# Patient Record
Sex: Female | Born: 1945 | Race: White | Hispanic: No
Health system: Southern US, Community
[De-identification: ages and names within clinical notes are randomized; demographics above are authoritative.]

---

## 2021-02-18 ENCOUNTER — Encounter (HOSPITAL_COMMUNITY): Payer: Self-pay | Admitting: Emergency Medicine

## 2021-02-18 ENCOUNTER — Other Ambulatory Visit: Payer: Self-pay

## 2021-02-18 ENCOUNTER — Emergency Department (HOSPITAL_COMMUNITY): Payer: Medicare Other

## 2021-02-18 ENCOUNTER — Emergency Department (HOSPITAL_COMMUNITY)
Admission: EM | Admit: 2021-02-18 | Discharge: 2021-02-18 | Disposition: A | Discharge status: Home / Self Care | Payer: Medicare Other | Attending: Emergency Medicine | Admitting: Emergency Medicine

## 2021-02-18 DIAGNOSIS — S0990XA Unspecified injury of head, initial encounter: Secondary | ICD-10-CM | POA: Diagnosis not present

## 2021-02-18 DIAGNOSIS — R0789 Other chest pain: Secondary | ICD-10-CM | POA: Diagnosis not present

## 2021-02-18 DIAGNOSIS — Y92002 Bathroom of unspecified non-institutional (private) residence single-family (private) house as the place of occurrence of the external cause: Secondary | ICD-10-CM | POA: Insufficient documentation

## 2021-02-18 DIAGNOSIS — M545 Low back pain, unspecified: Secondary | ICD-10-CM | POA: Insufficient documentation

## 2021-02-18 DIAGNOSIS — W19XXXA Unspecified fall, initial encounter: Secondary | ICD-10-CM

## 2021-02-18 DIAGNOSIS — W01198A Fall on same level from slipping, tripping and stumbling with subsequent striking against other object, initial encounter: Secondary | ICD-10-CM | POA: Diagnosis not present

## 2021-02-18 DIAGNOSIS — R52 Pain, unspecified: Secondary | ICD-10-CM

## 2021-02-18 DIAGNOSIS — Z91040 Latex allergy status: Secondary | ICD-10-CM | POA: Diagnosis not present

## 2021-02-18 DIAGNOSIS — R0781 Pleurodynia: Secondary | ICD-10-CM

## 2021-02-18 MED ORDER — DICLOFENAC SODIUM 1 % EX GEL: 4.0000 g | Freq: Four times a day (QID) | CUTANEOUS | 0 refills | Status: DC

## 2021-02-18 NOTE — ED Notes (Signed)
Pt provided discharge instructions and prescription information. Pt was given the opportunity to ask questions and questions were answered. Discharge signature not obtained in the setting of the COVID-19 pandemic in order to reduce high touch surfaces.  ° °

## 2021-02-18 NOTE — ED Provider Notes (Signed)
[facility] EMERGENCY DEPARTMENT Provider Note   CSN: [id number] Arrival date & time: 02/18/21  1224     History No chief complaint on file.   Victoria Hurst is a 75 y.o. female.  74 yo F with a chief complaint of a fall.  She was with a friend and needed use the bathroom.  She was in a hurry to get to the bathroom and back and she felt like she had misjudged her steps and tripped over her own feet and fell to the ground.  Complaining mostly of pain to her left side also struck her head and is having some low back pain.  She denies difficulty breathing denies confusion denies vomiting denies blood thinner use.  The history is provided by the patient.  Illness Severity:  Moderate Onset quality:  Gradual Duration:  1 day Timing:  Constant Progression:  Unchanged Chronicity:  New Associated symptoms: headaches and myalgias   Associated symptoms: no chest pain, no congestion, no fever, no nausea, no rhinorrhea, no shortness of breath, no vomiting and no wheezing       History reviewed. No pertinent past medical history.  There are no problems to display for this patient.   History reviewed. No pertinent surgical history.   OB History   No obstetric history on file.     No family history on file.     Home Medications Prior to Admission medications   Medication Sig Start Date End Date Taking? Authorizing Provider  albuterol (VENTOLIN HFA) 108 (90 Base) MCG/ACT inhaler Inhale 2 puffs into the lungs every 6 (six) hours as needed for wheezing or shortness of breath. 11/11/18  Yes [provider]  amLODipine (NORVASC) 10 MG tablet Take 10 mg by mouth daily. 01/24/21  Yes [provider]  atenolol (TENORMIN) 50 MG tablet Take 50 mg by mouth daily. 01/15/21  Yes [provider]  clobetasol ointment (TEMOVATE) 0.05 % Apply 1 application topically at bedtime. 05/12/19  Yes [provider]  diclofenac Sodium (VOLTAREN) 1 % GEL Apply 4 g  topically 4 (four) times daily. 02/18/21  Yes Melene Plan, DO  ergocalciferol (VITAMIN D2) 1.25 MG (50000 UT) capsule Take 50,000 Units by mouth every 30 (thirty) days. 02/11/21  Yes [provider]  esomeprazole (NEXIUM) 40 MG capsule Take 40 mg by mouth 2 (two) times daily as needed for heartburn. 02/03/21  Yes [provider]  fexofenadine (ALLEGRA) 180 MG tablet Take 180 mg by mouth daily. 01/24/21  Yes [provider]    Allergies    Codeine and Latex  Review of Systems   Review of Systems  Constitutional:  Negative for chills and fever.  HENT:  Negative for congestion and rhinorrhea.   Eyes:  Negative for redness and visual disturbance.  Respiratory:  Negative for shortness of breath and wheezing.   Cardiovascular:  Negative for chest pain and palpitations.  Gastrointestinal:  Negative for nausea and vomiting.  Genitourinary:  Negative for dysuria and urgency.  Musculoskeletal:  Positive for arthralgias, back pain and myalgias.  Skin:  Negative for pallor and wound.  Neurological:  Positive for headaches. Negative for dizziness.   Physical Exam Updated Vital Signs BP 133/76   Pulse 63   Temp 98.1 F (36.7 C) (Oral)   Resp 18   SpO2 99%   Physical Exam Vitals and nursing note reviewed.  Constitutional:      General: She is not in acute distress.    Appearance: She is well-developed.  She is not diaphoretic.  HENT:     Head: Normocephalic and atraumatic.  Eyes:     Pupils: Pupils are equal, round, and reactive to light.  Cardiovascular:     Rate and Rhythm: Normal rate and regular rhythm.     Heart sounds: No murmur heard.   No friction rub. No gallop.  Pulmonary:     Effort: Pulmonary effort is normal.     Breath sounds: No wheezing or rales.  Abdominal:     General: There is no distension.     Palpations: Abdomen is soft.     Tenderness: There is no abdominal tenderness.  Musculoskeletal:        General: Tenderness present.     Cervical  back: Normal range of motion and neck supple.     Comments: Tenderness along the left chest wall tenderness about the low back.  No obvious signs of head injury.  Palpated from head to toe without obvious other areas of significant pain.  Skin:    General: Skin is warm and dry.  Neurological:     Mental Status: She is alert and oriented to person, place, and time.  Psychiatric:        Behavior: Behavior normal.    ED Results / Procedures / Treatments   Labs (all labs ordered are listed, but only abnormal results are displayed) Labs Reviewed - No data to display  EKG None  Radiology DG Chest 1 View  Result Date: 02/18/2021 CLINICAL DATA:  Fall EXAM: CHEST  1 VIEW COMPARISON:  None. FINDINGS: The heart size and mediastinal contours are within normal limits. Both lungs are clear. The visualized skeletal structures are unremarkable. IMPRESSION: No active cardiopulmonary disease. Electronically Signed   By: Charlett Nose M.D.   On: 02/18/2021 14:45   DG Pelvis 1-2 Views  Result Date: 02/18/2021 CLINICAL DATA:  Fall EXAM: PELVIS - 1-2 VIEW COMPARISON:  None. FINDINGS: There is no evidence of pelvic fracture or diastasis. No pelvic bone lesions are seen. Hip joints and SI joints are symmetric. IMPRESSION: No acute bony abnormality. Electronically Signed   By: Charlett Nose M.D.   On: 02/18/2021 14:46   CT Head Wo Contrast  Result Date: 02/18/2021 CLINICAL DATA:  Fall EXAM: CT HEAD WITHOUT CONTRAST TECHNIQUE: Contiguous axial images were obtained from the base of the skull through the vertex without intravenous contrast. 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 No acute intracranial abnormality. Specifically, no hemorrhage, hydrocephalus, mass lesion, acute infarction, or significant intracranial injury. Vascular: No hyperdense vessel or unexpected calcification. Skull: No acute calvarial abnormality. Sinuses/Orbits: No acute  findings Other: None IMPRESSION: Old right frontal infarct. No acute intracranial abnormality. Electronically Signed   By: Charlett Nose M.D.   On: 02/18/2021 14:45   CT Lumbar Spine Wo Contrast  Result Date: 02/18/2021 CLINICAL DATA:  Fall, low back pain EXAM: CT LUMBAR SPINE WITHOUT CONTRAST TECHNIQUE: Multidetector CT imaging of the lumbar spine was performed without intravenous contrast administration. Multiplanar CT image reconstructions were also generated. COMPARISON:  CT lumbar spine from 11/21/2020 FINDINGS: Segmentation: The lowest lumbar type non-rib-bearing vertebra is labeled as L5. Alignment: No vertebral subluxation is observed. Vertebrae: No lumbar spine fracture or acute subluxation identified. Paraspinal and other soft tissues: Aortoiliac atherosclerotic vascular disease. Disc levels: Mild right subarticular lateral recess stenosis at L5-S1 due to intervertebral spurring and conjoined right L5 and S1 nerve roots. Not appreciably changed from prior. Slightly accentuated anterior epidural  prominence posterior to the L5 vertebra, possibly due to epidural venous plexus although I cannot totally exclude the possibility of ectopic disc material. IMPRESSION: 1. No lumbar spine fracture or acute subluxation. 2. Mild right subarticular lateral recess stenosis at L5-S1 due to spurring and conjoined right L5 and S1 nerve roots. 3. Prominent epidural venous plexus versus ectopic disc material anterior to the thecal sac at the L5 level, not causing substantial impingement. 4.  Aortic Atherosclerosis (ICD10-I70.0). Electronically Signed   By: Gaylyn Rong M.D.   On: 02/18/2021 14:53   DG Knee Complete 4 Views Left  Result Date: 02/18/2021 CLINICAL DATA:  Fall, left knee pain EXAM: LEFT KNEE - COMPLETE 4+ VIEW COMPARISON:  None. FINDINGS: No evidence of fracture, dislocation, or joint effusion. No evidence of arthropathy or other focal bone abnormality. Soft tissues are unremarkable. IMPRESSION:  Negative. Electronically Signed   By: Charlett Nose M.D.   On: 02/18/2021 14:46    Procedures Procedures   Medications Ordered in ED Medications - No data to display  ED Course  I have reviewed the triage vital signs and the nursing notes.  Pertinent labs & imaging results that were available during my care of the patient were reviewed by me and considered in my medical decision making (see chart for details).    MDM Rules/Calculators/A&P                          75 yo F with a chief complaints of a fall.  This happened prior to arrival.  Nonsyncopal by history.  Well-appearing and not in significant discomfort.  Chest x-ray viewed by me without obvious fracture or pneumothorax.  Possible occult rib fracture.  Will start on incentive spirometry.  PCP follow-up.  7:24 PM:  I have discussed the diagnosis/risks/treatment options with the patient and believe the pt to be eligible for discharge home to follow-up with PCP. We also discussed returning to the ED immediately if new or worsening sx occur. We discussed the sx which are most concerning (e.g., sudden worsening pain, fever, inability to tolerate by mouth) that necessitate immediate return. Medications administered to the patient during their visit and any new prescriptions provided to the patient are listed below.  Medications given during this visit Medications - No data to display   The patient appears reasonably screen and/or stabilized for discharge and I doubt any other medical condition or other [facility] requiring further screening, evaluation, or treatment in the ED at this time prior to discharge.   Final Clinical Impression(s) / ED Diagnoses Final diagnoses:  Fall, initial encounter  Left-sided chest wall pain  Closed head injury, initial encounter  Acute bilateral low back pain without sciatica    Rx / DC Orders ED Discharge Orders          Ordered    diclofenac Sodium (VOLTAREN) 1 % GEL  4 times daily        02/18/21  1915             Melene Plan, DO 02/18/21 1924

## 2021-02-18 NOTE — ED Triage Notes (Signed)
Patient slipped and fell in bathroom today. No loc. Complains of back pain and left elbow pain. No deformity and no bruising noted

## 2021-02-18 NOTE — ED Provider Notes (Signed)
Emergency Medicine Provider Triage Evaluation Note  Kendall Arnell , a 75 y.o. female  was evaluated in triage.  Pt complains of patient presents after a mechanical fall, states she was in the hospital trying to seeing one of her family members that are here, she unfortunate tripped over legs fine onto her left side, states she hit her head, denies losing conscious, is not on anticoagulant.  She endorses head pain, lower back pain, bilateral posterior rib pain, as well as hip pain and left knee pain.  Has no other complaints at this time..  Review of Systems  Positive: Head pain, rib pain Negative: Shortness of breath or chest pain  Physical Exam  BP (!) 148/67 (BP Location: Right Arm)   Pulse (!) 53   Temp 98.1 F (36.7 C) (Oral)   Resp 16   SpO2 98%  Gen:   Awake, no distress   Resp:  Normal effort  MSK:   Moves extremities without difficulty  Other:    Medical Decision Making  Medically screening exam initiated at 1:58 PM.  Appropriate orders placed.  Yezenia Fredrick was informed that the remainder of the evaluation will be completed by another provider, this initial triage assessment does not replace that evaluation, and the importance of remaining in the ED until their evaluation is complete.  Patient presents for a fall, imaging was ordered, patient had further work-up in the emergency department.   Carroll Sage, PA-C 02/18/21 1359    Gerhard Munch, MD 02/21/21 0930

## 2021-02-18 NOTE — Discharge Instructions (Addendum)
We did not find an obvious break to one of your bones or bleeding inside your head.  Please take Tylenol and the gel I prescribed you for pain.  Even though your chest x-ray does not show an obvious rib fracture is not uncommon to still have a regular rib is not seen on x-ray.  Please use the incentive spirometer 10 minutes out of every hour that you are awake for the next week.  Please call your family doctor and let them know that this happened to you.  They need to evaluate you in the office probably in about a week.  Whenever you to fall this could be a sign that there is something wrong and your doctor needs to evaluate your medications and maybe consider physical therapy.  Please return to the emergency department for fever or shortness of breath.

## 2023-02-28 IMAGING — CT CT HEAD W/O CM
4 series · 16 of 47 positions shown, 18 images · non-contrast
Comparison: 11/21/2020

CLINICAL DATA: Fall

EXAM:
CT HEAD WITHOUT CONTRAST
TECHNIQUE: Contiguous axial images were obtained from the base of the skull
through the vertex without intravenous contrast.

[Series 3: head without · axial · non-contrast · 0.44mm/px · z∈[-134,-14]mm · 7 of 32 slices shown, 9 images]
[im 4/32  brain]
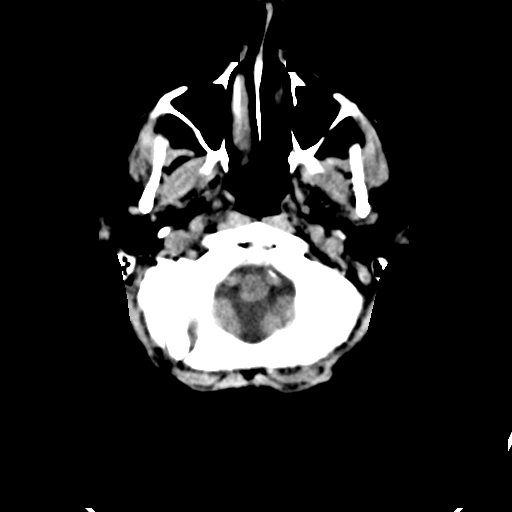
[im 4/32  bone]
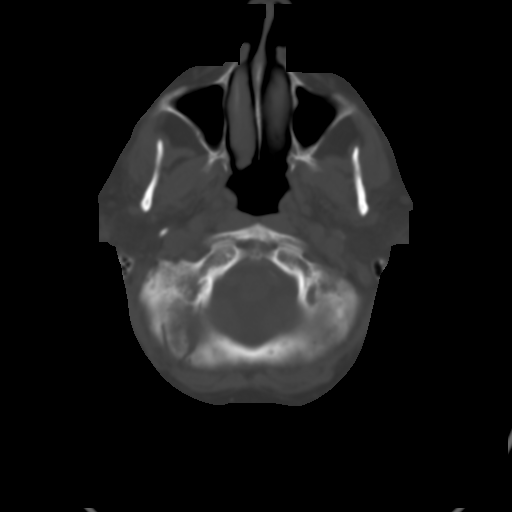
[im 8/32  brain]
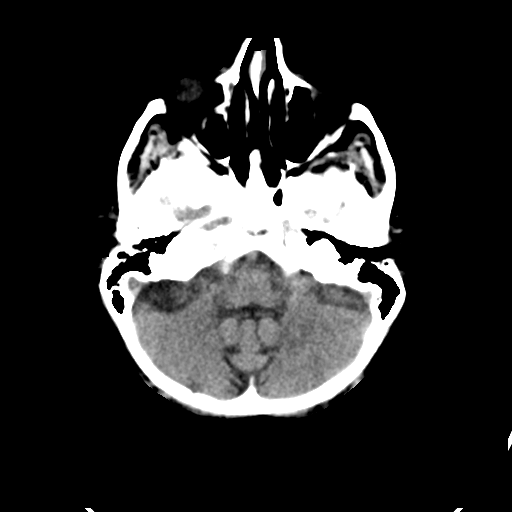
[im 12/32  brain]
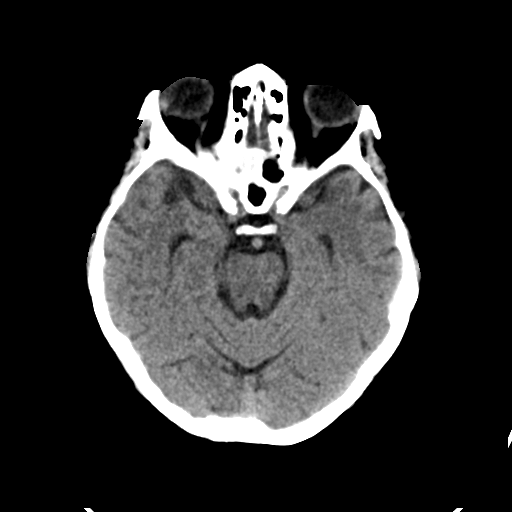
[im 16/32  brain]
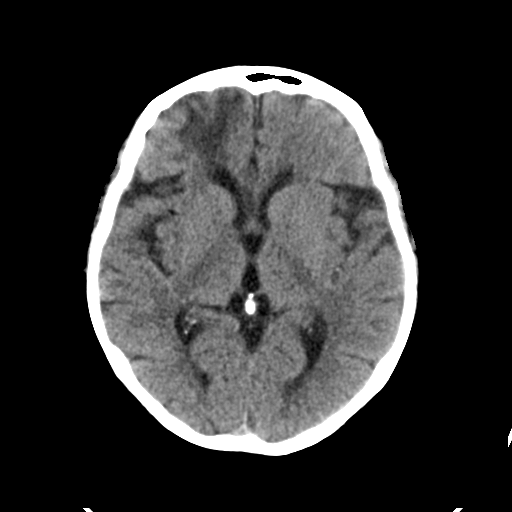
[im 20/32  brain]
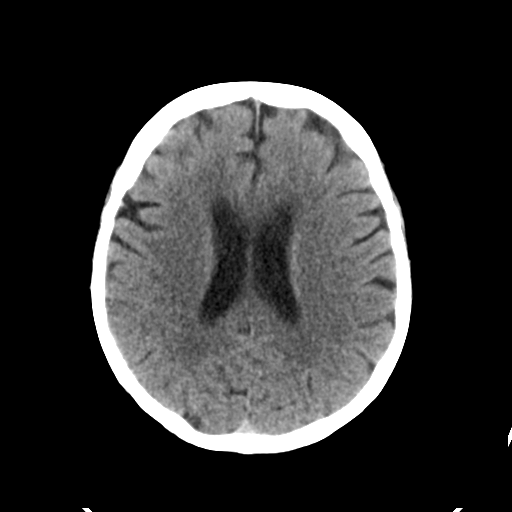
[im 20/32  bone]
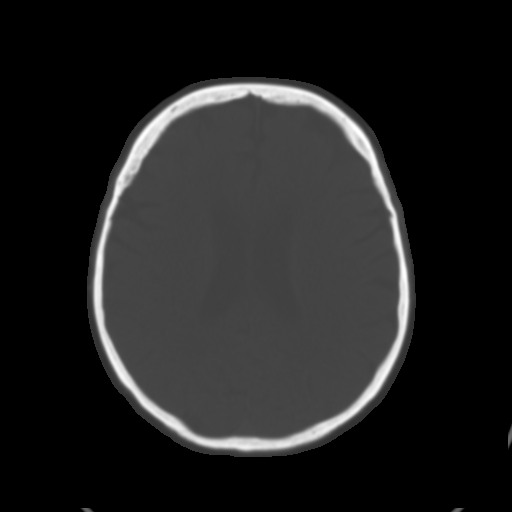
[im 24/32  brain]
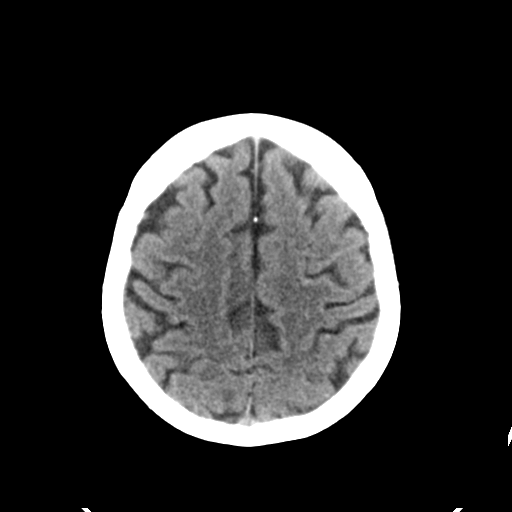
[im 28/32  brain]
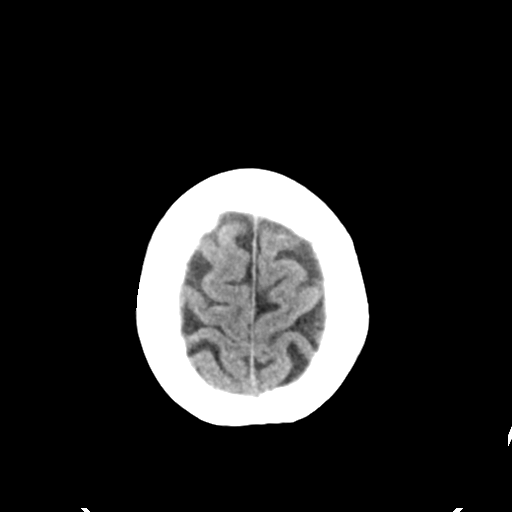

[Series 4: head bone · axial · 0.44mm/px · z∈[-135,-103]mm · 3 of 79 slices shown]
[im 8/79  bone]
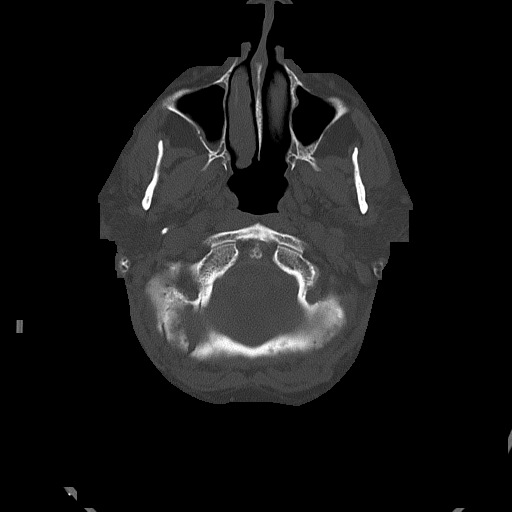
[im 16/79  bone]
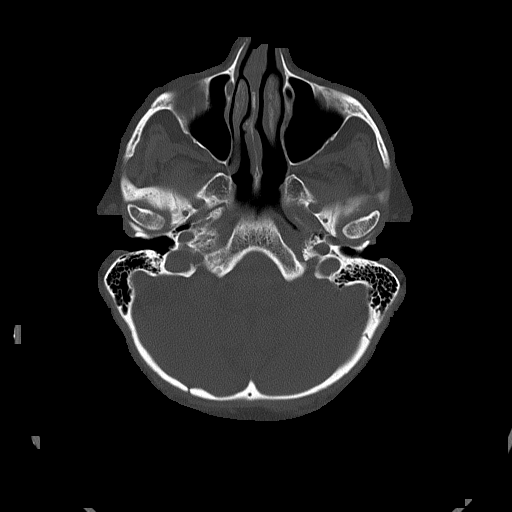
[im 24/79  bone]
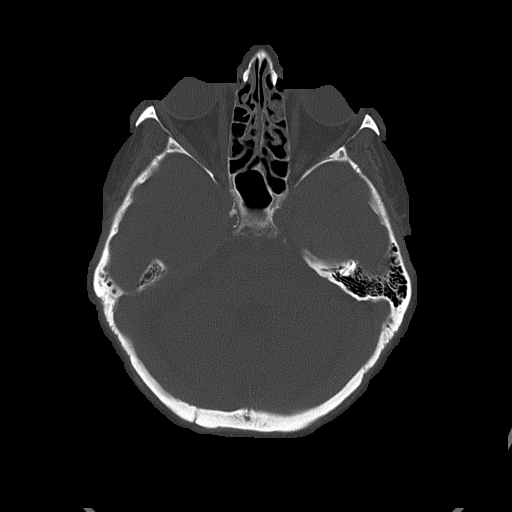

[Series 5: head without cor · coronal · non-contrast · 0.32mm/px · 3 of 67 slices shown]
[im 23/67  brain]
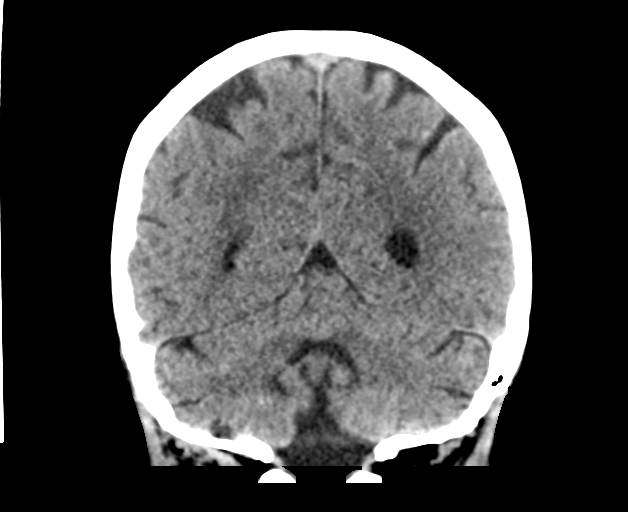
[im 30/67  brain]
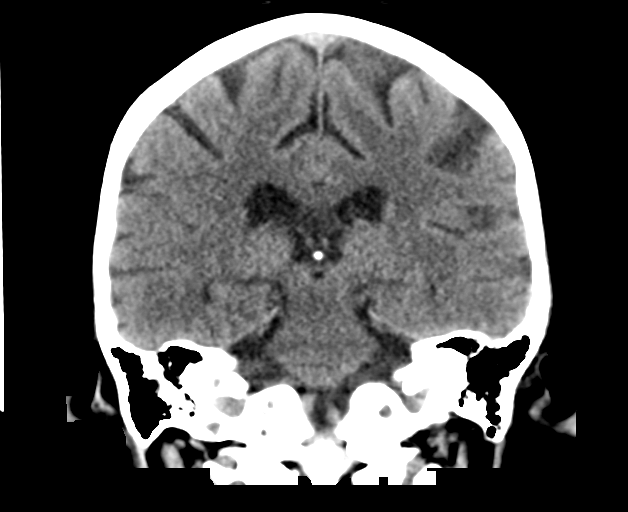
[im 37/67  brain]
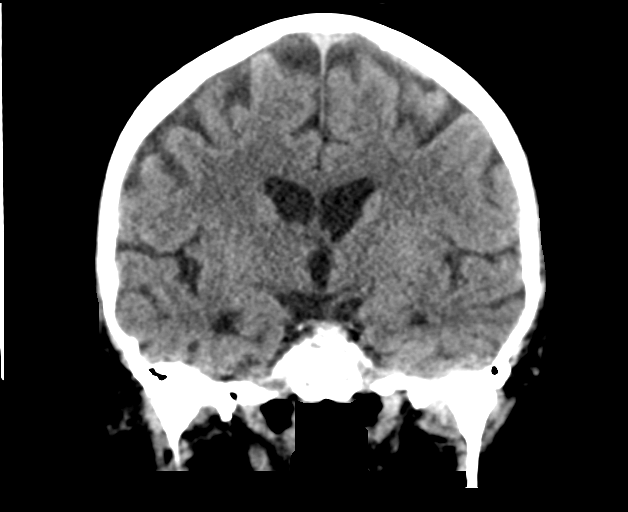

[Series 6: head without sag · sagittal · non-contrast · 0.33mm/px · 3 of 66 slices shown]
[im 22/66  brain]
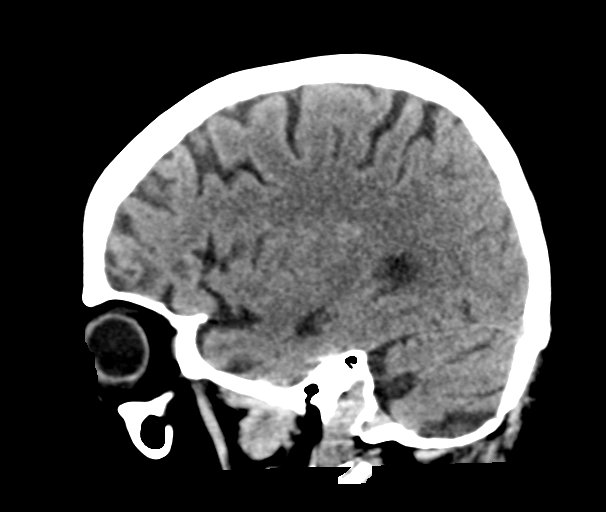
[im 33/66  brain]
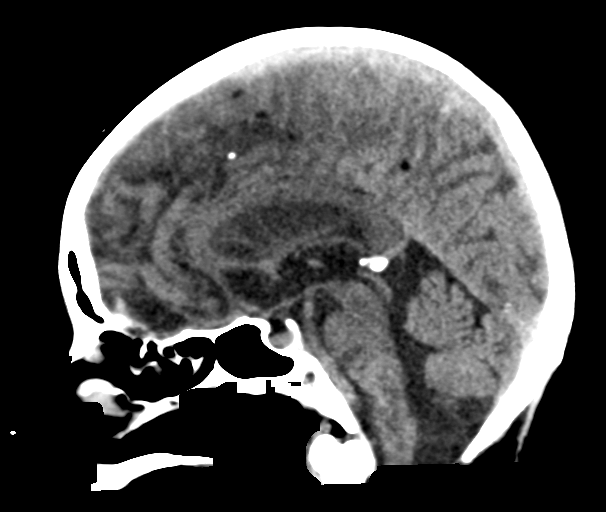
[im 44/66  brain]
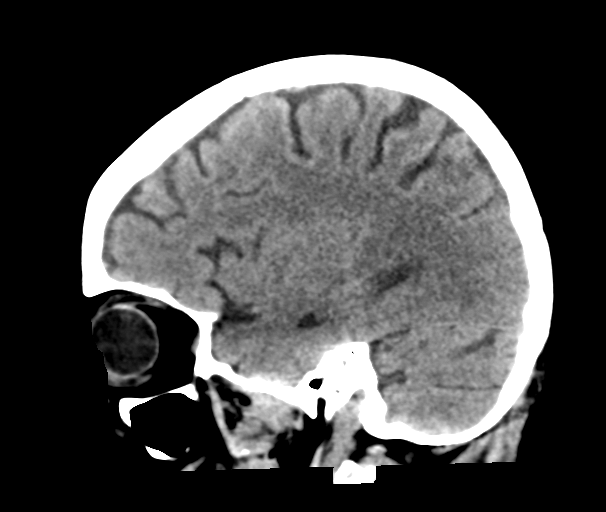

[16 of 47 positions shown; findings below may reference images not displayed]

FINDINGS: Brain: Old infarct with encephalomalacia in the right frontal lobe,
stable. No acute intracranial abnormality. Specifically, no
hemorrhage, hydrocephalus, mass lesion, acute infarction, or
significant intracranial injury.

Vascular: No hyperdense vessel or unexpected calcification.

Skull: No acute calvarial abnormality.

Sinuses/Orbits: No acute findings

Other: None
IMPRESSION: Old right frontal infarct.

No acute intracranial abnormality.

## 2023-02-28 IMAGING — DX DG KNEE COMPLETE 4+V*L*
5 series · 5 of 5 positions shown · non-contrast
Comparison: None.

CLINICAL DATA: Fall, left knee pain

EXAM:
LEFT KNEE - COMPLETE 4+ VIEW

[knee ap]
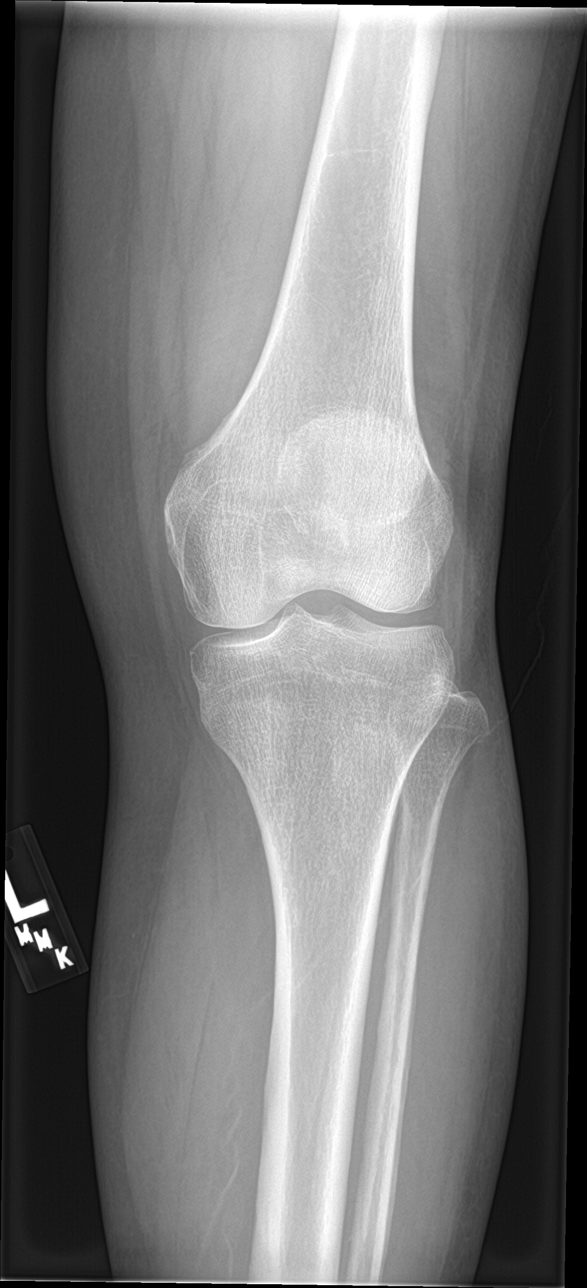

[knee lat (1 of 2)]
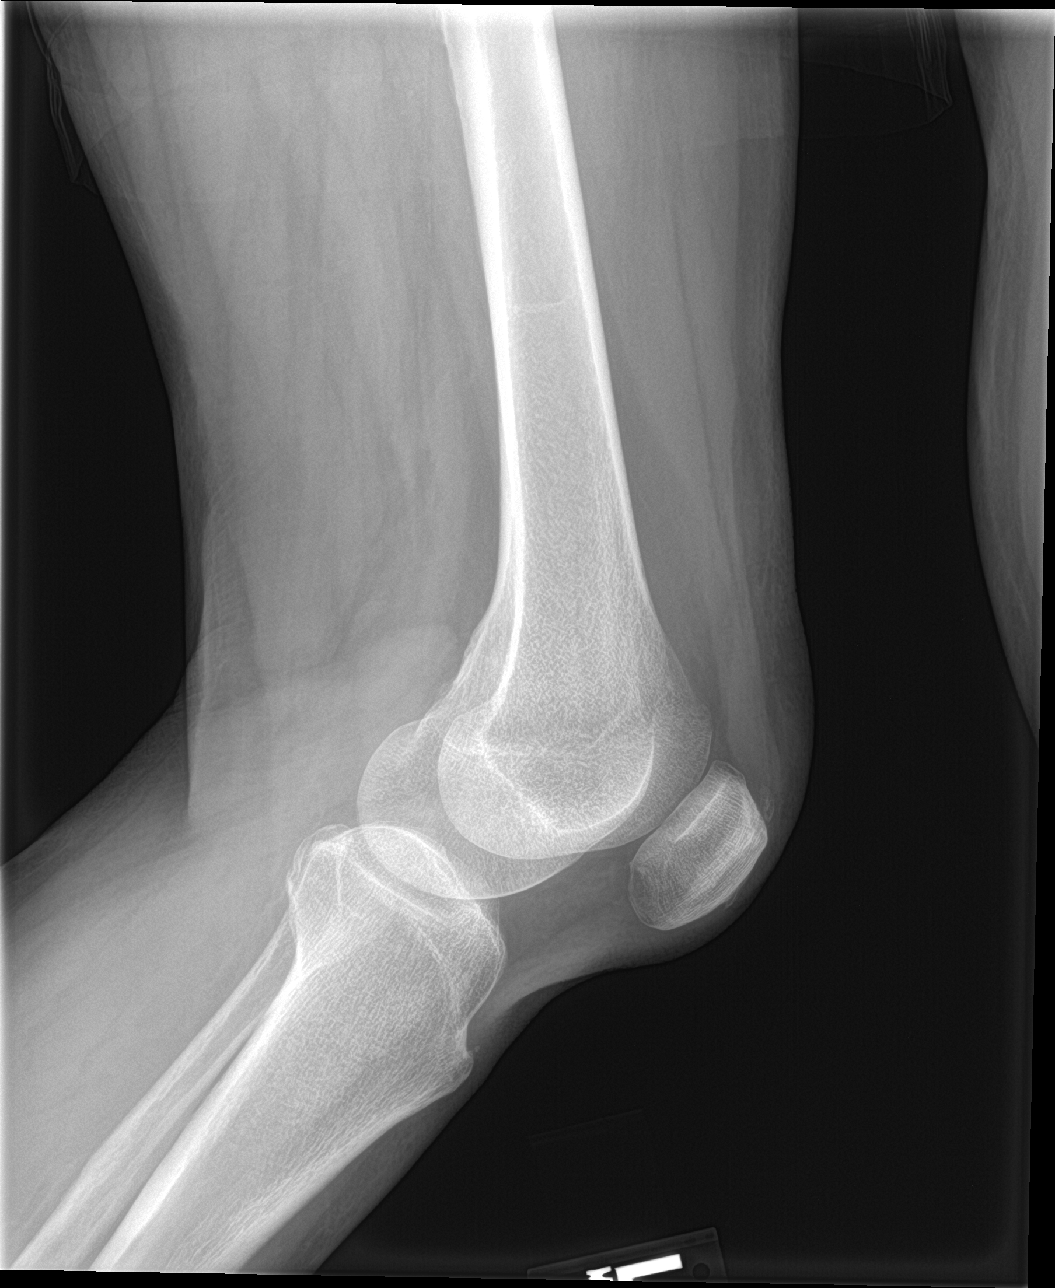

[knee obl (1 of 2)]
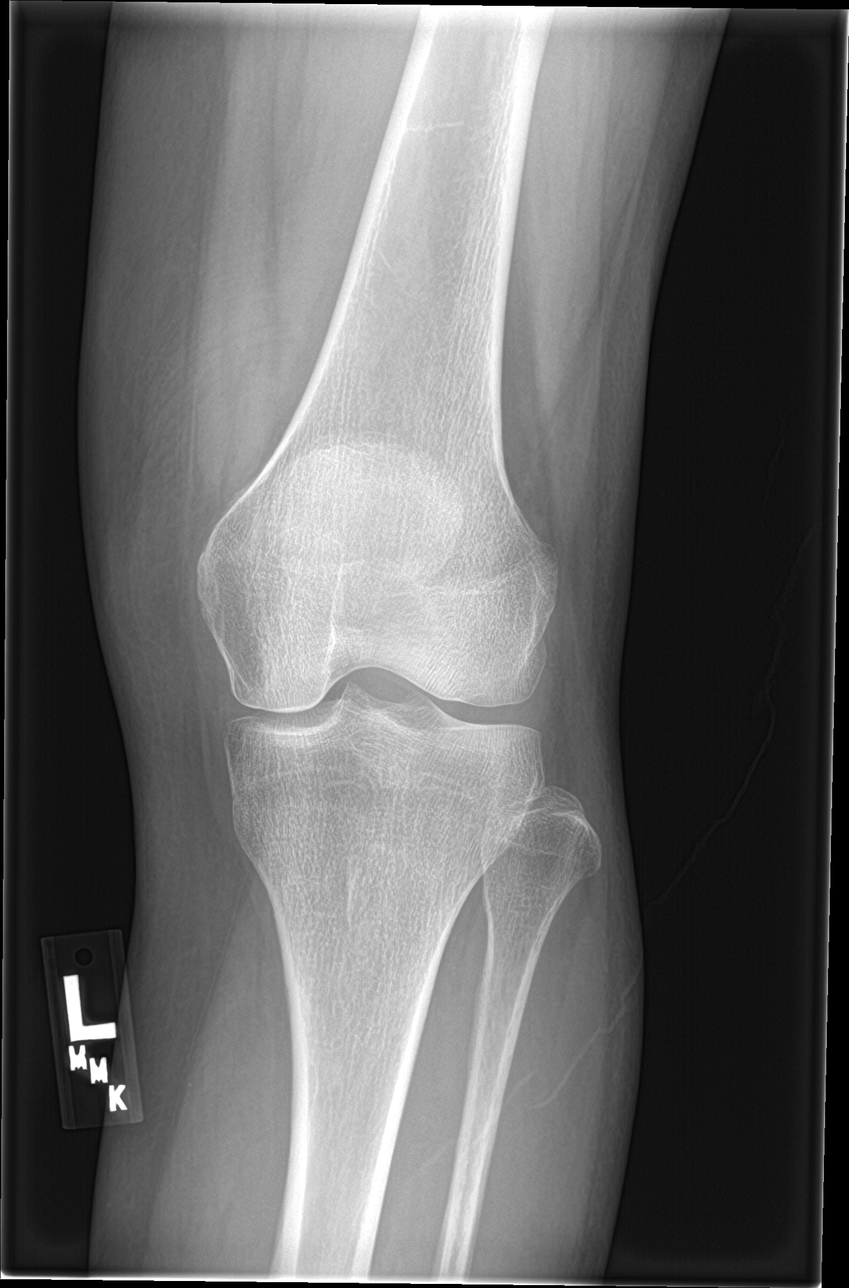

[knee obl (2 of 2)]
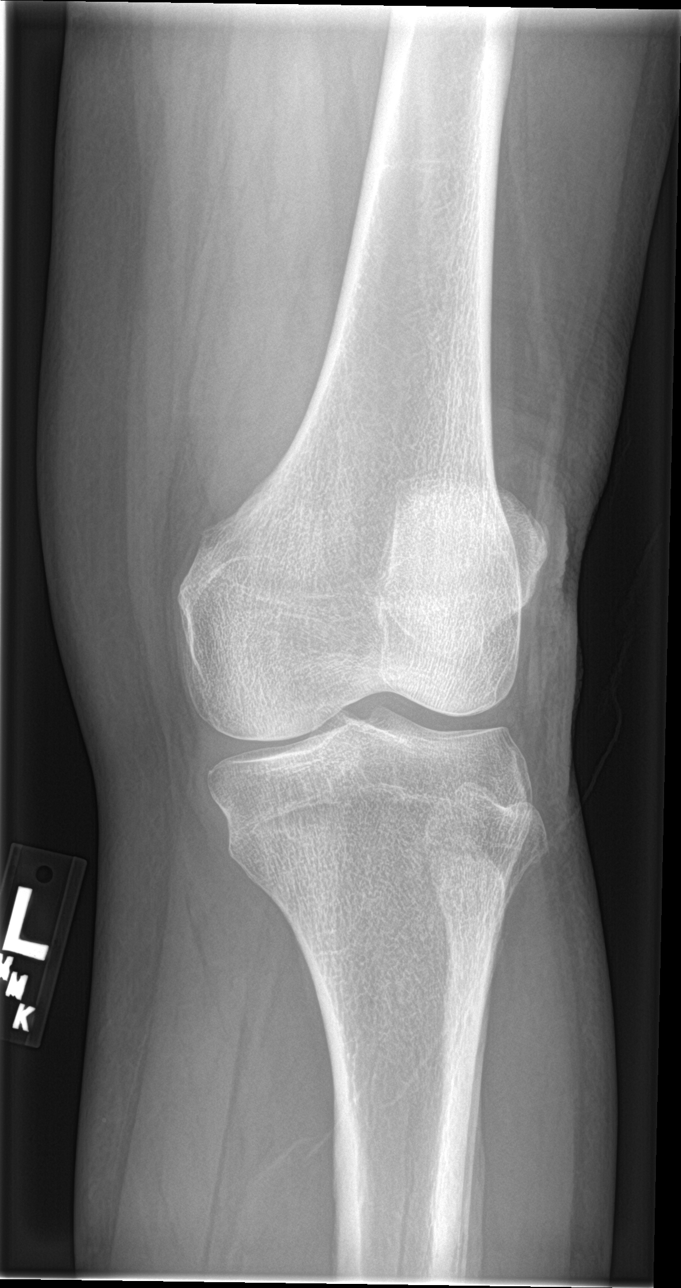

[knee lat (2 of 2)]
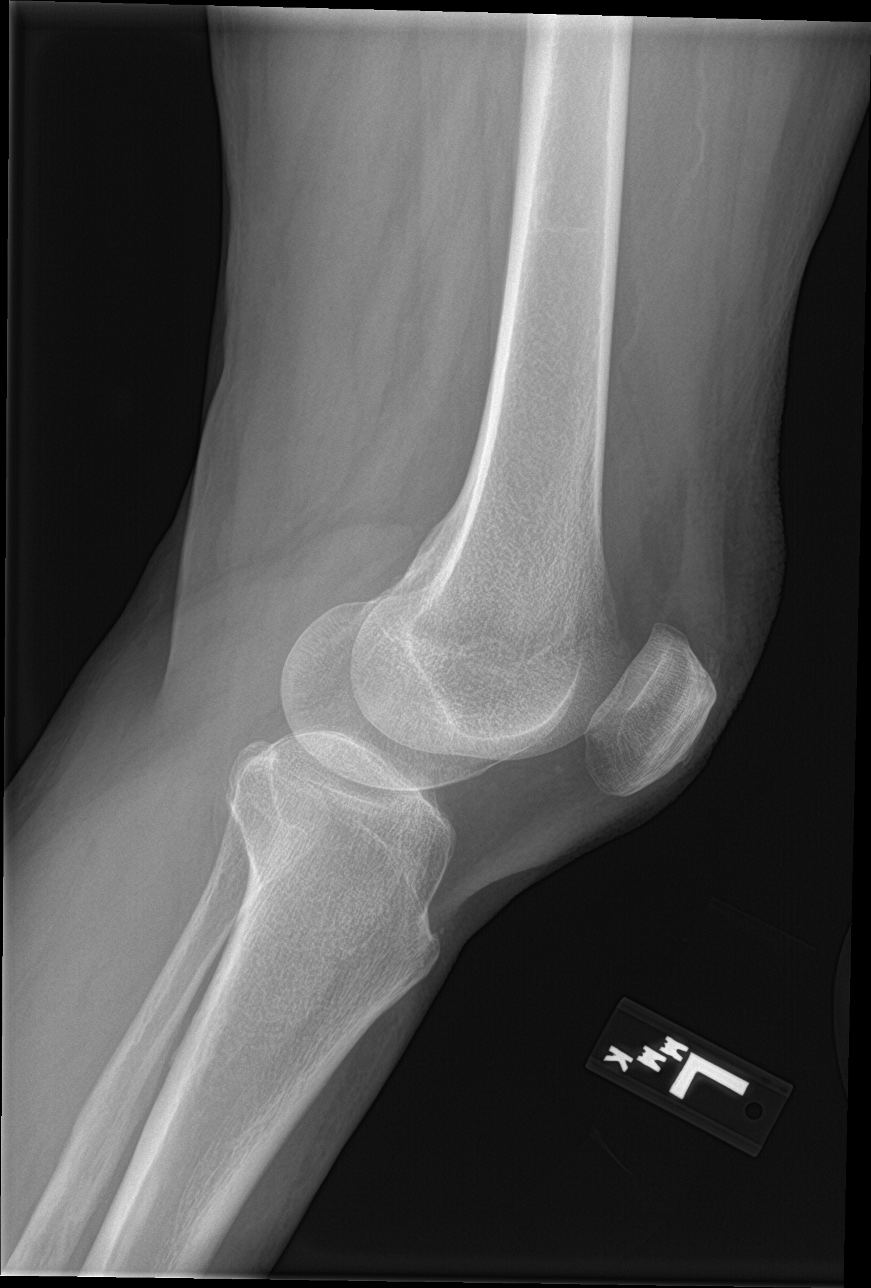

[5 of 5 positions shown; findings below may reference images not displayed]

FINDINGS: No evidence of fracture, dislocation, or joint effusion. No evidence
of arthropathy or other focal bone abnormality. Soft tissues are
unremarkable.
IMPRESSION: Negative.

## 2024-05-10 DEATH — deceased
